# Patient Record
Sex: Male | Born: 1995 | Race: White | Hispanic: No | Marital: Single
Health system: Southern US, Community
[De-identification: ages and names within clinical notes are randomized; demographics above are authoritative.]

---
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
## 2018-12-12 ENCOUNTER — Encounter (HOSPITAL_COMMUNITY): Payer: Self-pay

## 2018-12-12 ENCOUNTER — Ambulatory Visit (HOSPITAL_COMMUNITY)
Admission: EM | Admit: 2018-12-12 | Discharge: 2018-12-12 | Disposition: A | Discharge status: Home / Self Care | Payer: Commercial Managed Care - PPO | Attending: Family Medicine | Admitting: Family Medicine

## 2018-12-12 DIAGNOSIS — J029 Acute pharyngitis, unspecified: Secondary | ICD-10-CM | POA: Diagnosis present

## 2018-12-12 LAB — POCT RAPID STREP A: Streptococcus, Group A Screen (Direct): NEGATIVE

## 2018-12-12 LAB — POCT INFECTIOUS MONO SCREEN: MONO SCREEN: NEGATIVE

## 2018-12-12 MED ORDER — AMOXICILLIN 500 MG PO CAPS: 500.0000 mg | ORAL_CAPSULE | Freq: Two times a day (BID) | ORAL | 0 refills | Status: AC

## 2018-12-12 NOTE — Discharge Instructions (Signed)
Strep was negative and mono was negative.  Based on symptoms and physical exam today I will treat you for strep.    Push fluids and get rest Prescribed amoxicillin 500mg  twice daily for 10 days.  Take as directed and to completion.  Drink warm or cool liquids, use throat lozenges, or popsicles to help alleviate symptoms Take OTC ibuprofen or tylenol as needed for pain [facility]CP [facility] if [facility] persist Return or go to ER if you have any new or worsening symptoms such as fever, chills, nausea, vomiting, worsening sore throat, cough, abdominal pain, chest pain, changes in bowel or bladder habits, etc...Marland Kitchen

## 2018-12-12 NOTE — ED Triage Notes (Signed)
Pt presents with sore throat. 

## 2018-12-12 NOTE — ED Provider Notes (Signed)
[facility]   [id number] 12/12/18 Arrival Time: 1124  NW:GNFACC:SORE THROAT  SUBJECTIVE: History from: patient.  Hunter Mcguire is a 22 y.o. male who presents with worsening sore throat x 5 days. Denies positive sick exposure or precipitating event.  Does admit to new kissing contacts recently.  Has tried OTC medications without relief.  Symptoms are made worse with swallowing, but tolerating liquids and own secretions without difficulty.  Denies previous symptoms in the past.  Mild RT ear pressure.  Denies fever, chills, fatigue, sinus pain, rhinorrhea, nasal congestion, cough, SOB, wheezing, chest pain, nausea, rash, changes in bowel or bladder habits.    ROS: As per HPI.  History reviewed. No pertinent past medical history. History reviewed. No pertinent surgical history. No Known Allergies No current facility-administered medications on file prior to encounter.    No current outpatient medications on file prior to encounter.   Social History   Socioeconomic History  . Marital status: Single    Spouse name: Not on file  . Number of children: Not on file  . Years of education: Not on file  . Highest education level: Not on file  Occupational History  . Not on file  Social Needs  . Financial resource strain: Not on file  . Food insecurity:    Worry: Not on file    Inability: Not on file  . Transportation needs:    Medical: Not on file    Non-medical: Not on file  Tobacco Use  . Smoking status: Not on file  Substance and Sexual Activity  . Alcohol use: Not on file  . Drug use: Not on file  . Sexual activity: Not on file  Lifestyle  . Physical activity:    Days per week: Not on file    Minutes per session: Not on file  . Stress: Not on file  Relationships  . Social connections:    Talks on phone: Not on file    Gets together: Not on file    Attends religious service: Not on file    Active member of club or organization: Not on file    Attends meetings of  clubs or organizations: Not on file    Relationship status: Not on file  . Intimate partner violence:    Fear of current or ex partner: Not on file    Emotionally abused: Not on file    Physically abused: Not on file    Forced sexual activity: Not on file  Other Topics Concern  . Not on file  Social History Narrative  . Not on file   History reviewed. No pertinent family history.  OBJECTIVE:  Vitals:   12/12/18 1246  BP: 119/71  Pulse: 100  Resp: 16  Temp: 98.6 F (37 C)  TempSrc: Oral  SpO2: 97%     General appearance: alert; appears fatigued, but nontoxic, speaking in full sentences and managing own secretions HEENT: NCAT; Ears: EACs clear, TMs pearly gray with visible cone of light, without erythema; Eyes: PERRL, EOMI grossly; Nose: no obvious rhinorrhea; Throat: oropharynx clear, tonsils 3+ and erythematous without white tonsillar exudates, uvula midline Neck: supple without LAD Lungs: CTA bilaterally without adventitious breath sounds; cough absent Heart: regular rate and rhythm.  Radial pulses 2+ symmetrical bilaterally Skin: warm and dry Psychological: alert and cooperative; normal mood and affect  LABS: Results for orders placed or performed during the hospital encounter of 12/12/18 (from the past 24 hour(s))  POCT rapid strep A [facility])  Status: None   Collection Time: 12/12/18  1:16 PM  Result Value Ref Range   Streptococcus, Group A Screen (Direct) NEGATIVE NEGATIVE  Infectious mono screen, POC     Status: None   Collection Time: 12/12/18  1:35 PM  Result Value Ref Range   Mono Screen NEGATIVE NEGATIVE     ASSESSMENT & PLAN:  1. Acute pharyngitis, unspecified etiology     Meds ordered this encounter  Medications  . amoxicillin (AMOXIL) 500 MG capsule    Sig: Take 1 capsule (500 mg total) by mouth 2 (two) times daily for 10 days.    Dispense:  20 capsule    Refill:  0    Order Specific Question:   Supervising Provider    Answer:    Eustace MooreELSON, YVONNE SUE [[id number]]    Strep and mono were negative.  However, based on symptoms and physical exam today I will treat you for strep.    Push fluids and get rest Prescribed amoxicillin 500mg  twice daily for 10 days.  Take as directed and to completion.  Drink warm or cool liquids, use throat lozenges, or popsicles to help alleviate symptoms Take OTC ibuprofen or tylenol as needed for pain Follow [facility] with PCP [facility] if symptoms persist Return or go to ER if you have any new or worsening symptoms such as fever, chills, nausea, vomiting, worsening sore throat, cough, abdominal pain, chest pain, changes in bowel or bladder habits, etc...   Reviewed expectations re: course of current medical issues. Questions answered. Outlined signs and symptoms indicating need for more acute intervention. Patient verbalized understanding. After Visit Summary given.        Rennis HardingWurst, Fumio Vandam, PA-C 12/12/18 1354

## 2018-12-15 LAB — CULTURE, GROUP A STREP (THRC)
